# Patient Record
Sex: Male | Born: 1985 | Hispanic: Yes | Marital: Married
Health system: Midwestern US, Community
[De-identification: ages and names within clinical notes are randomized; demographics above are authoritative.]

## PROBLEM LIST (undated history)

## (undated) DIAGNOSIS — H00012 Hordeolum externum right lower eyelid: Secondary | ICD-10-CM

---

## 2018-12-10 NOTE — Progress Notes (Signed)
Formatting of this note might be different from the original.  [place]    Name:  Cole Mcknight  MRN:  [id number]   DOB:  [birth year]  PCP:  No primary care provider on file.    ASSESSMENT AND PLAN:    1. Immigration exam  Class A and B conditions, TB, syphilis, gonorrhea results and immunizations reviewed  Forms and documents complete  No evidence of active substance use or behavioral health conditions associated with harmful behavior    Walden Field, MD  12/10/2018 1:28 PM EST    REASON FOR VISIT  Immigration Exam    SUBJECTIVE: 33 year old patient for immigration exam. I have fully reviewed the past medical, surgical, social and family history and updated the Histories.      ROS: No prolonged cough, fever or rashes.  No urinary symptoms or lesions    OBJECTIVE:   BP 126/80  Pulse 100  Temp 97.1 F (36.2 C)  Resp 14  Ht 6' 0.05" (1.83 m)  Wt 267 lb (121.1 kg)  SpO2 97%  BMI 36.16 kg/m  BSA 2.48 m  Pain Sc 0/10 (Edu: Yes)  GEN: Alert, no acute distress  HEAD AND NECK:  Ears normal.  Throat, oral cavity and tongue normal.  Neck supple. No adenopathy or masses in the neck or supraclavicular regions.    NEURO: Cranial nerves and fundi are normal. Neck supple. DTR's normal and symmetric.    CHEST:  Clear, good air entry, no wheezes, rhonchi or rales.   HEART:  S1 and S2 normal, no murmurs, clicks, gallops or rubs.  Regular rate and rhythm.  No edema  ABDOMEN:  Soft without tenderness, guarding, mass or organomegaly. No CVA tenderness  EXTREMITIES:  Extremities, reflexes and peripheral pulses are normal.    SKIN:  No rashes or suspicious skin lesions noted.     Electronically signed by Adriana Reams, MD at 12/11/2018  2:06 PM PST

## 2019-08-25 NOTE — ED Triage Notes (Signed)
Formatting of this note might be different from the original.  34 yo male for home with co 2 days of sore throat with L sided headache, some L sided blurred vision, 2 doses of otc med without relief, afebrile, able to tol po food and fluid, able to speak in complete sentences without difficulty, non-productive cough x2 days.   Electronically signed by Theodosia Paling, RN at 08/25/2019  6:23 AM EDT

## 2021-12-28 NOTE — Telephone Encounter (Signed)
Formatting of this note is different from the original.   Rx Care Gap Status - Instructions for Clinical Staff (prescriber discretion applies):      > At least one medication below does not meet full criteria. Please see medication-specific renewal instructions below.       > Labs due: Remind patient to get lab tests done soon.       > No new orders needed.            BMP            Lipid panel       Visit Info     Last visit:  12/27/2021    Harrie Jeans, MD - Family Medicine    [facility]        > Requested f/u: Return in about 4 months (around 04/28/2022).     Upcoming visit: 04/25/2022 Palma Holter, Mallie Darting, MD - [facility])    **ACTIONS TAKEN BY Geralyn Corwin, RN**  - Refill protocol passed: no action needed.     Diabetes Rx Protocol (on Diabetes Registry)     - blood-glucose sensor   Criteria not met; renew for up to 3 months.     Visit in the past 14 months: Yes     Clinical criteria:   - BMP within past year: None (has active order)    - A1c within past 6 months: Yes    - Lipid panel within past year: None (has active order) (LDL 128 on 02/24/2020)   - Urine microalbumin within past year or on ACE/ARB: Yes       Lab Results   Component Value Date    SODIUM 138 02/24/2020    POTASSIUM 4.1 02/24/2020    CHLORIDE 103 02/24/2020    CO2 21 (L) 02/24/2020    BUN 9 02/24/2020    CREATININE 0.82 02/24/2020    EGFR 117 02/24/2020     Lab Results   Component Value Date    HEMOGLOBIN A1C 7.1 (H) 12/20/2021     Lab Results   Component Value Date    LDL 128 02/24/2020    HDL 50 02/24/2020    CARDIAC RISK RATIO 4.2 02/24/2020    TRIGLYCERIDES 163 (H) 02/24/2020    CHOLESTEROL 211 (H) 02/24/2020     Health Maintenance Labs Due / Due Soon   Topic Date Due    CREATININE LEVEL  02/23/2021    POTASSIUM LEVEL  02/23/2021       Electronically signed by Geralyn Corwin, RN at 07/62/2633  3:13 PM EST

## 2022-01-06 ENCOUNTER — Inpatient Hospital Stay
Admit: 2022-01-06 | Discharge: 2022-01-06 | Disposition: A | Discharge status: Home / Self Care | Payer: BLUE CROSS/BLUE SHIELD

## 2022-01-06 DIAGNOSIS — H0012 Chalazion right lower eyelid: Secondary | ICD-10-CM

## 2022-01-06 NOTE — ED Triage Notes (Addendum)
Pt arrives to ED reporting soreness in the right eye that began yesterday around noontime. Pt awoke this morning at around 0600 and noticed that there is swelling in the area of the right eye. Pt reports no compromise to his vision. Pt denied accompanying headache, dizziness, vertigo, nausea or vomiting. No excessive discharge from the eye.     Pt is diabetic.

## 2022-01-06 NOTE — ED Notes (Signed)
Patient ambulated to waiting room without difficulty / all belongings with patient

## 2022-01-06 NOTE — ED Provider Notes (Signed)
HPI    36 year old male patient with no significant past medical history presenting for right lower lid pain and swelling since yesterday.  He states that he had some soreness yesterday.  This morning he woke up and the lower lid was red and swollen.  He denies use of any contact lenses.  He does wear glasses.  Denies any traumas.  Yesterday he was exposed to some dogs and was worried if he might have had any allergies to the dog causing the swelling.  States that he has not had any significant drainage.  Denies any abnormal watery or thick discharge from his eyes.  Denies any fever.  Denies any pain with extraocular movements.    Medical History:  Current Problem List:   There is no problem list on file for this patient.      Past Medical History:  No past medical history on file.    No past surgical history on file.    Social History     Socioeconomic History    Marital status: Married     Spouse name: Not on file    Number of children: Not on file    Years of education: Not on file    Highest education level: Not on file   Occupational History    Not on file   Tobacco Use    Smoking status: Not on file    Smokeless tobacco: Not on file   Substance and Sexual Activity    Alcohol use: Not on file    Drug use: Not on file    Sexual activity: Not on file   Other Topics Concern    Not on file   Social History Narrative    Not on file     Social Determinants of Health     Financial Resource Strain: Not on file   Food Insecurity: Not on file   Transportation Needs: Not on file   Physical Activity: Not on file   Stress: Not on file   Social Connections: Not on file   Intimate Partner Violence: Not on file   Housing Stability: Not on file       Family History:  No family history on file.    Medications:  Patient medication list reviewed  This patient does not have an active medication from one of the medication groupers.    Allergies:    Patient has no known allergies.    Review of Systems   Constitutional:  Negative  for fever.   Eyes:  Negative for pain, discharge, redness and itching.        R lower lid swelling   Respiratory:  Negative for shortness of breath.    Cardiovascular:  Negative for chest pain.         Vital Signs for this visit:  Vitals:    01/06/22 0639 01/06/22 0827   BP: 122/88 124/88   Pulse: 80 82   Resp: 16 16   Temp: 97.3 F (36.3 C) 97.7 F (36.5 C)   TempSrc: Temporal Tympanic   SpO2: 97%    Weight: 122.5 kg (270 lb)    Height: 1.829 m (6')          Physical Exam  Vitals and nursing note reviewed.   Constitutional:       General: He is not in acute distress.     Appearance: Normal appearance. He is not ill-appearing, toxic-appearing or diaphoretic.   HENT:      Nose: No congestion  or rhinorrhea.      Mouth/Throat:      Mouth: Mucous membranes are moist.      Pharynx: Oropharynx is clear. No oropharyngeal exudate or posterior oropharyngeal erythema.   Eyes:      General:         Right eye: No discharge.         Left eye: No discharge.      Extraocular Movements: Extraocular movements intact.      Conjunctiva/sclera: Conjunctivae normal.      Pupils: Pupils are equal, round, and reactive to light.      Comments: Very small pustule R lower lid associated with mild erythema and swelling   Cardiovascular:      Rate and Rhythm: Normal rate.      Pulses: Normal pulses.   Pulmonary:      Effort: Pulmonary effort is normal. No respiratory distress.   Neurological:      General: No focal deficit present.      Mental Status: He is alert and oriented to person, place, and time. Mental status is at baseline.         Procedures      Medical Decision Making  36 year old male patient with no significant past medical history presenting for right lower lid pain and swelling since yesterday. On exam, patient alert, oriented, well appearing afebrile. The patient's R lower lid appears to be mildly erythematous and swollen. There appears to be a small pustule to the center of the inner lower lid.     This appears to be a  stye. Low suspicion for periorbital cellulitis, conjunctivitis. Discussed warm compresses with gentle massaging and supportive care. He will return if there are any evidence of fever, or worsening. He will otherwise follow up with PCP for any persistent symptoms. Patient in agreement with plan.              Lab orders and findings that I have personally reviewed and interpreted during this visit.  Only abnormal values will be noted:  Abnormal Labs Reviewed - No data to display    Recent radiology studies including this visit.  I have personally reviewed these studies and my personal interpretation, if available, is documented in the ED Course:  No orders to display       Medications given in the ED:  Medications - No data to display    Diagnosis:  1. Hordeolum of right lower eyelid, unspecified hordeolum type        ED Course:           Please note that portions of this document were created using the M*Modal Fluency Direct dictation system.  Any inconsistencies or typographical errors may be the result of mis-transcription that persist in spite of proof-reading and should be addressed with the document creator.         Pandora Leiter, [place]  01/06/22 [phone]

## 2022-01-06 NOTE — Discharge Instructions (Signed)
Warm compresses 15-20 minutes several times a day for pain and swelling.  You can apply some gentle massage to the area.  Do not wear any makeup or contact lenses.   Do not put any topicals on the area or put in any eye drops.    Please continue to monitor the area.  If you have worsening redness/swelling in the area, please consider re-evaluation in ED.

## 2022-08-25 IMAGING — MR e+1 JOELHO DIREITO
4 of 5 series · 19 of 40 positions shown · non-contrast
Comparison: none

[Series 4: sag dp fs · sagittal · 3.5mm · 0.31mm/px · 3 of 24 slices shown]
[im 4/24]
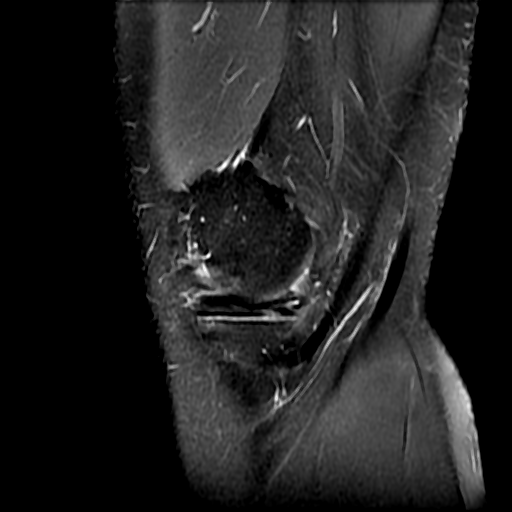
[im 14/24]
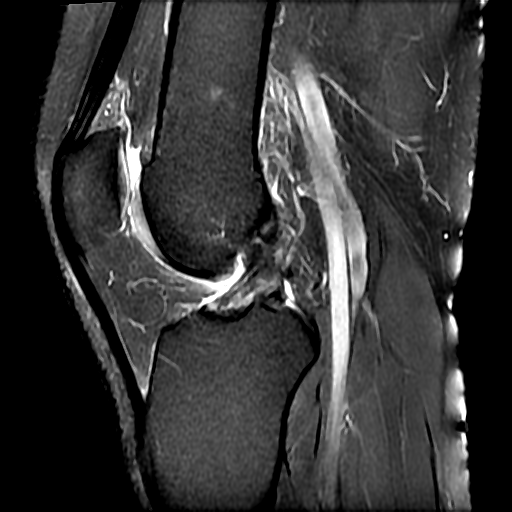
[im 20/24]
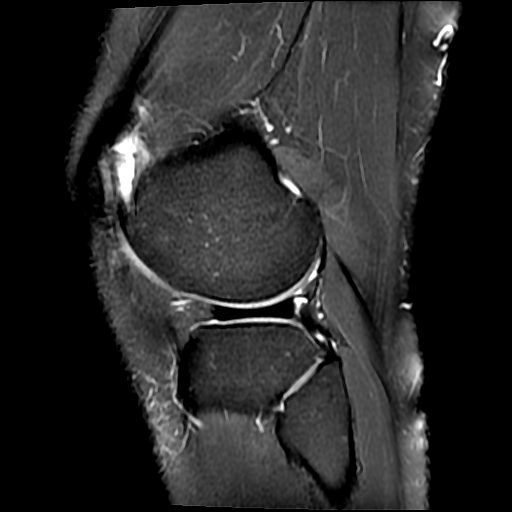

[Series 6: T1 · coronal · 3.0mm · 0.31mm/px · 3 of 24 slices shown]
[im 4/24]
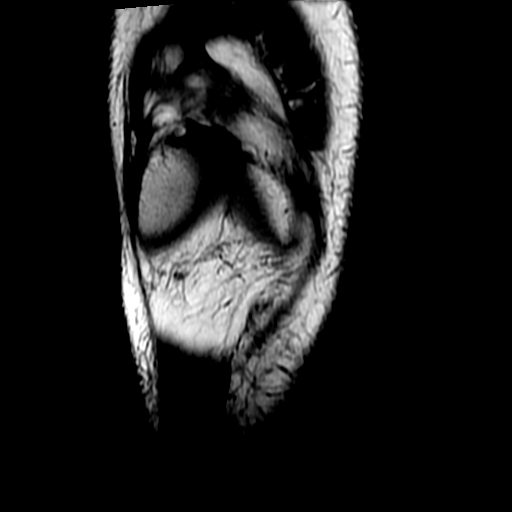
[im 12/24]
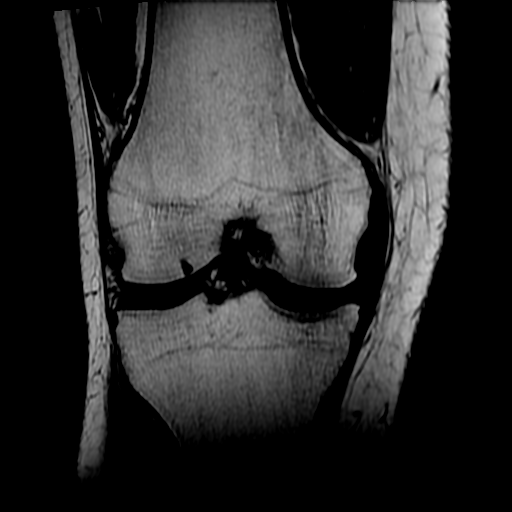
[im 20/24]
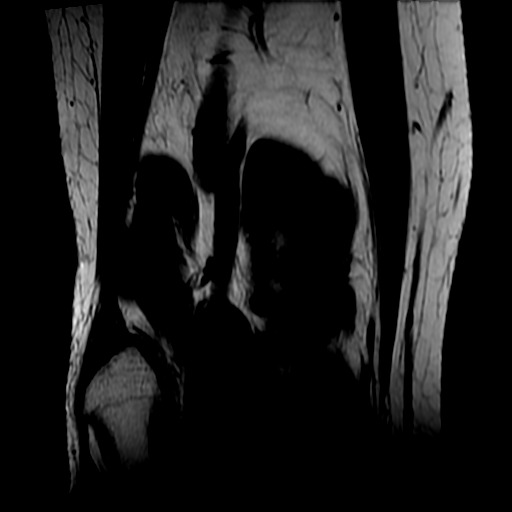

[Series 7: PD fat-sat · axial · 3.5mm · 0.29mm/px · z∈[-73,+37]mm · 9 of 30 slices shown (1 of 2)]
[im 1/30]
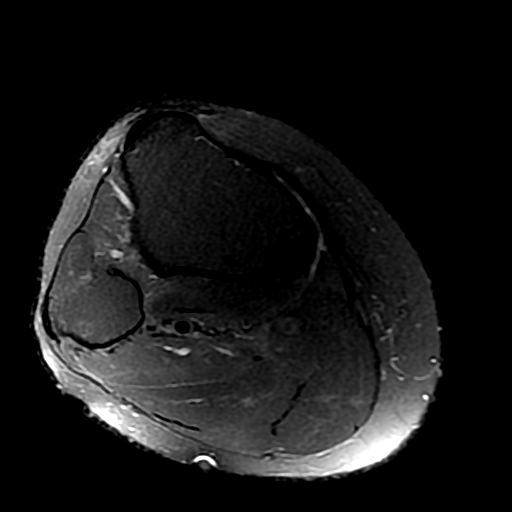
[im 4/30]
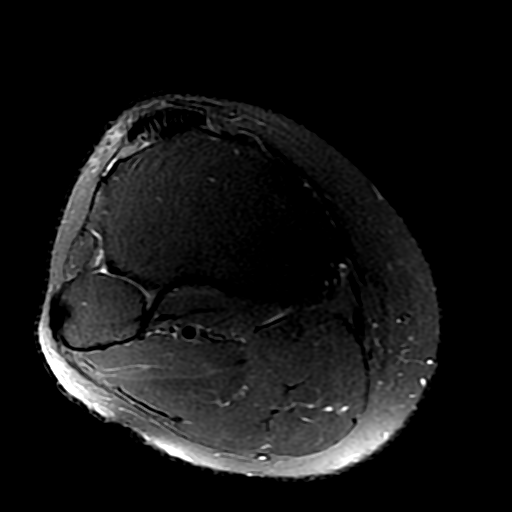
[im 8/30]
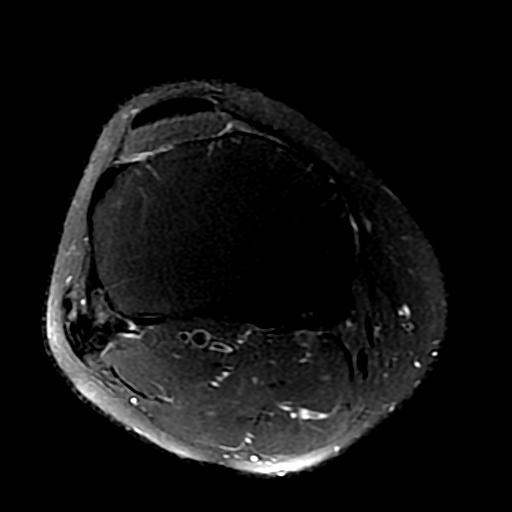
[im 11/30]
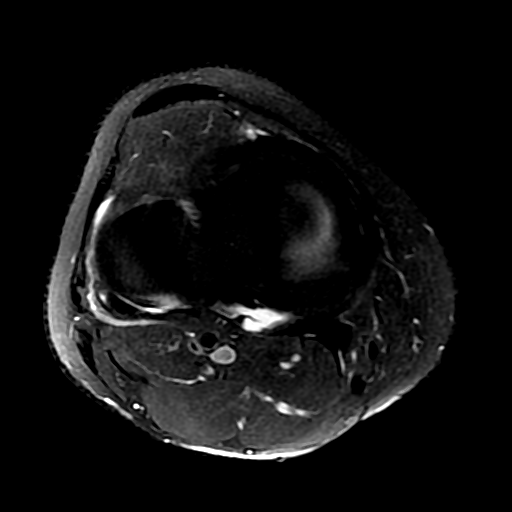
[im 15/30]
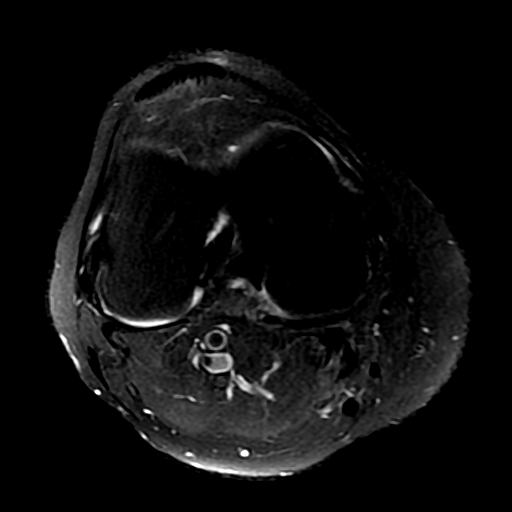
[im 19/30]
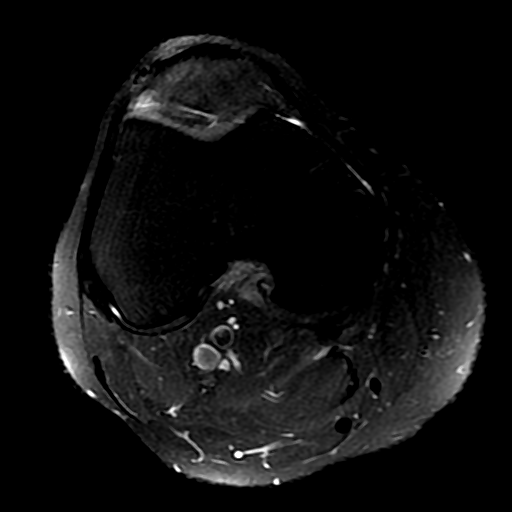
[im 22/30]
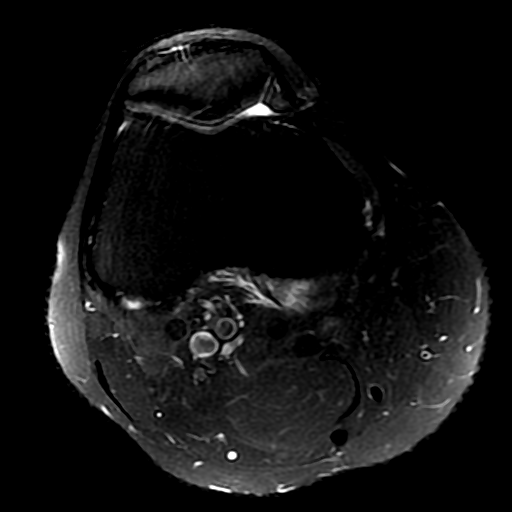
[im 26/30]
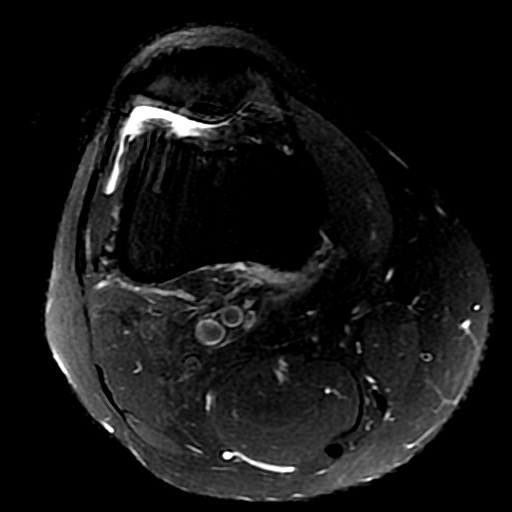
[im 30/30]
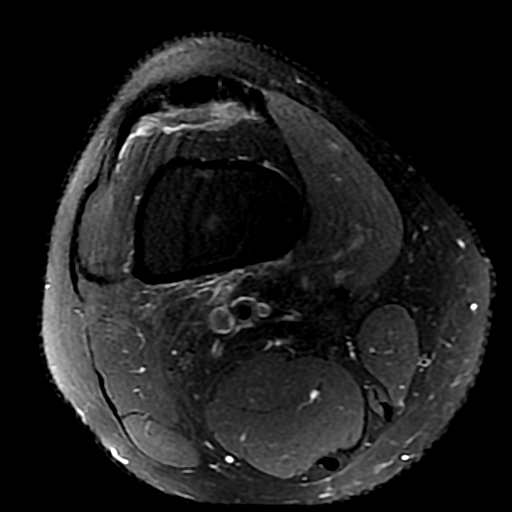

[Series 8: PD fat-sat · axial · 3.5mm · 0.29mm/px · z∈[-73,+22]mm · 4 of 30 slices shown (2 of 2)]
[im 1/30]
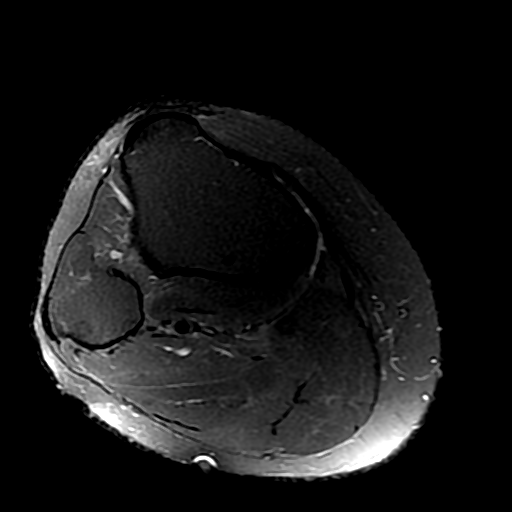
[im 4/30]
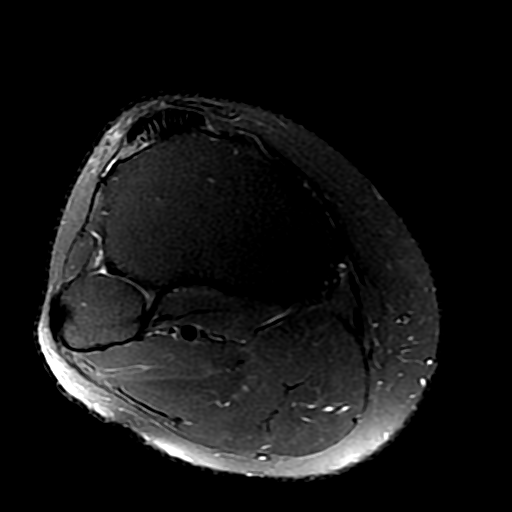
[im 15/30]
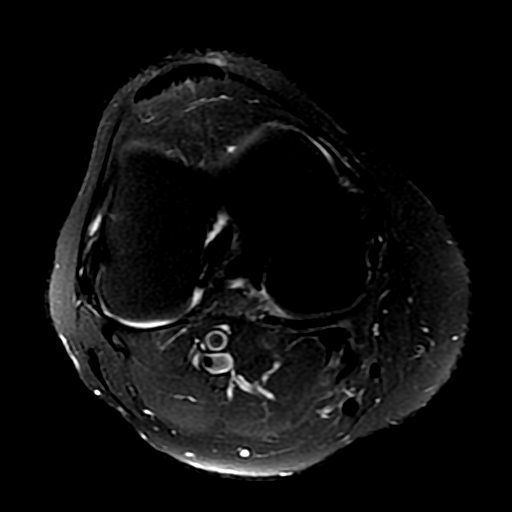
[im 26/30]
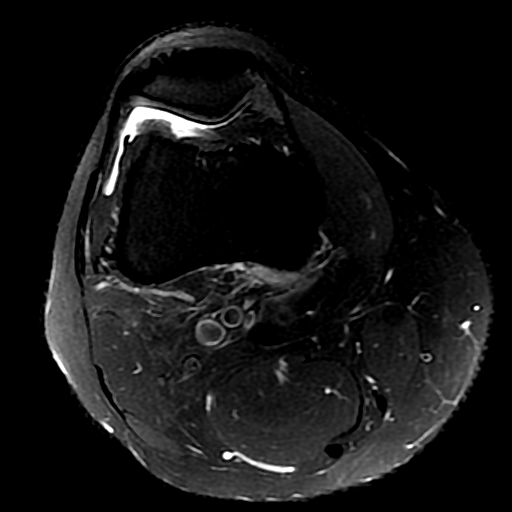

[19 of 40 positions shown; findings below may reference images not displayed]

RESSONÂNCIA MAGNÉTICA DO JOELHO DIREITO
Método:  
Estudo realizado com a técnica FE e FSE, com cortes multiplanares.
Análise:  
Discreta condropatia patelar caracterizada por edema condral difuso,  sem erosões profundas ou alterações
subcondrais. 
Discreta condropatia troclear  caracterizada por edema condral difuso,  sem erosões profundas ou alterações
subcondrais.
Discreto edema da gordura  infrapatelar lateral, indicando atrito/hipersolicitação do mecanismo extensor. 
Tendão quadríceps e ligamento patelar preservados.
Ligamentos cruzados e colaterais íntegros.
Rotura horizontal  no corpo e  no corno posterior do menisco medial  com extensão para ambas as superficies
articulares. Associa-se  cisto perimeniscal junto ao  corno posterior, de 2,2 cm. 
Menisco lateral preservado.
Superfícies condrais do compartimento femorotibiais preservadas.
Demais estruturas ósseas e superfícies condrais preservadas.
Ausência de derrame articular ou sinovite significativos.
Planos miotendíneos de aspecto habitual.
Edema do subcutâneo na região anterior do joelho, sem coleções.
Impressão diagnóstica:
Discreta condropatia patelar e troclear.  
Sinais de leve atrito/hipersolicitação do mecanismo extensor.
Rotura do menisco medial.  Associa-se  cisto perimeniscal.
Edema do subcutâneo na região anterior do joelho, sem coleções.
